# Patient Record
Sex: Male | Born: 1965 | Race: White | Hispanic: No | Marital: Single | Smoking: Never smoker
Health system: Southern US, Community
[De-identification: ages and names within clinical notes are randomized; demographics above are authoritative.]

---

## 2011-10-04 ENCOUNTER — Other Ambulatory Visit: Payer: Self-pay | Admitting: Orthopedic Surgery

## 2011-10-04 DIAGNOSIS — M25531 Pain in right wrist: Secondary | ICD-10-CM

## 2011-10-04 DIAGNOSIS — R531 Weakness: Secondary | ICD-10-CM

## 2011-10-04 DIAGNOSIS — R609 Edema, unspecified: Secondary | ICD-10-CM

## 2011-10-05 ENCOUNTER — Ambulatory Visit
Admission: RE | Admit: 2011-10-05 | Discharge: 2011-10-05 | Disposition: A | Discharge status: Home / Self Care | Payer: 59 | Source: Ambulatory Visit | Attending: Orthopedic Surgery | Admitting: Orthopedic Surgery

## 2011-10-05 DIAGNOSIS — R531 Weakness: Secondary | ICD-10-CM

## 2011-10-05 DIAGNOSIS — R609 Edema, unspecified: Secondary | ICD-10-CM

## 2011-10-05 DIAGNOSIS — M25531 Pain in right wrist: Secondary | ICD-10-CM

## 2018-04-16 ENCOUNTER — Ambulatory Visit (INDEPENDENT_AMBULATORY_CARE_PROVIDER_SITE_OTHER): Payer: BLUE CROSS/BLUE SHIELD | Admitting: Orthopedic Surgery

## 2018-04-16 ENCOUNTER — Encounter (INDEPENDENT_AMBULATORY_CARE_PROVIDER_SITE_OTHER): Payer: Self-pay | Admitting: Orthopedic Surgery

## 2018-04-16 ENCOUNTER — Ambulatory Visit (INDEPENDENT_AMBULATORY_CARE_PROVIDER_SITE_OTHER): Payer: Self-pay

## 2018-04-16 VITALS — Ht 75.0 in | Wt 240.0 lb

## 2018-04-16 DIAGNOSIS — S838X2A Sprain of other specified parts of left knee, initial encounter: Secondary | ICD-10-CM

## 2018-04-16 DIAGNOSIS — G8929 Other chronic pain: Secondary | ICD-10-CM

## 2018-04-16 DIAGNOSIS — M25562 Pain in left knee: Secondary | ICD-10-CM | POA: Diagnosis not present

## 2018-04-16 DIAGNOSIS — Y9367 Activity, basketball: Secondary | ICD-10-CM

## 2018-04-16 MED ORDER — LIDOCAINE HCL 1 % IJ SOLN
5.0000 mL | INTRAMUSCULAR | Status: AC | PRN
Start: 2018-04-16 — End: 2018-04-16
  Administered 2018-04-16: 5 mL

## 2018-04-16 MED ORDER — METHYLPREDNISOLONE ACETATE 40 MG/ML IJ SUSP
40.0000 mg | INTRAMUSCULAR | Status: AC | PRN
  Administered 2018-04-16: 40 mg via INTRA_ARTICULAR

## 2018-04-16 MED ORDER — BUPIVACAINE HCL 0.25 % IJ SOLN
4.0000 mL | INTRAMUSCULAR | Status: AC | PRN
  Administered 2018-04-16: 4 mL via INTRA_ARTICULAR

## 2018-04-16 NOTE — Progress Notes (Signed)
Office Visit Note   Patient: Kyle Vaughan           Date of Birth: 03/01/1966           MRN: [id number] Visit Date: 04/16/2018 Requested by: No referring provider defined for this encounter. PCP: Patient, No Pcp Per  Subjective: Chief Complaint  Patient presents with  . Left Knee - Pain    HPI: Kyle Vaughan is a patient with left knee pain for 2 months.  Reports swelling.  He injured it playing basketball a few months ago.  Did well but then woke up with pain.  Describes new popping and primarily lateral more than medial sided joint symptoms.  He does desk work.  He plays basketball in church league every week but has not played basketball since this injury.  Going up and down stairs is painful.  He has been taking some over-the-counter medication without much relief.  That amount has been minimal.              ROS: All systems reviewed are negative as they relate to the chief complaint within the history of present illness.  Patient denies  fevers or chills.   Assessment & Plan: Visit Diagnoses:  1. Chronic pain of left knee   2. Injury of meniscus of left knee, initial encounter     Plan: Impression is left knee pain with normal radiographs and likely degenerative meniscal pathology present.  I am to try an injection today and then get an MRI scan due to length of symptoms.  He is had symptoms now for 2 months.  See him back after that study and we can decide for or against any further type of intervention depending on what it shows.  This is definitely a change for him compared to how he was feeling in October.  Follow-Up Instructions: Return for after MRI.   Orders:  Orders Placed This Encounter  Procedures  . XR KNEE 3 VIEW LEFT  . MR Knee Left w/o contrast   No orders of the defined types were placed in this encounter.     Procedures: Large Joint Inj: L knee on 04/16/2018 11:39 AM Indications: diagnostic evaluation, joint swelling and pain Details: 18 G 1.5 in needle,  superolateral approach  Arthrogram: No  Medications: 5 mL lidocaine 1 %; 40 mg methylPREDNISolone acetate 40 MG/ML; 4 mL bupivacaine 0.25 % Outcome: tolerated well, no immediate complications Procedure, treatment alternatives, risks and benefits explained, specific risks discussed. Consent was given by the patient. Immediately prior to procedure a time out was called to verify the correct patient, procedure, equipment, support staff and site/side marked as required. Patient was prepped and draped in the usual sterile fashion.       Clinical Data: No additional findings.  Objective: Vital Signs: Ht 6\' 3"  (1.905 m)   Wt 240 lb (108.9 kg)   BMI 30.00 kg/m   Physical Exam:   Constitutional: Patient appears well-developed HEENT:  Head: Normocephalic Eyes:EOM are normal Neck: Normal range of motion Cardiovascular: Normal rate Pulmonary/chest: Effort normal Neurologic: Patient is alert Skin: Skin is warm Psychiatric: Patient has normal mood and affect    Ortho Exam: Ortho exam demonstrates full active and passive range of motion of that left knee.  There is a little bit of lateral joint line tenderness extensor mechanism is intact.  Collateral cruciate ligaments feel stable.  Pedal pulses palpable.  No groin pain with internal X rotation of the leg.  No other masses lymphadenopathy or  skin changes noted in that left knee region  Specialty Comments:  No specialty comments available.  Imaging: Xr Knee 3 View Left  Result Date: 04/16/2018 AP lateral outlet left knee reviewed.  No significant fracture or dislocation is present.  Mild medial joint space narrowing is noted.  Alignment normal.  Patella well aligned within the trochlea.  No spurring.    PMFS History: There are no active problems to display for this patient.  History reviewed. No pertinent past medical history.  History reviewed. No pertinent family history.  History reviewed. No pertinent surgical  history. Social History   Occupational History  . Not on file  Tobacco Use  . Smoking status: Never Smoker  . Smokeless tobacco: Never Used  Substance and Sexual Activity  . Alcohol use: Yes  . Drug use: Not on file  . Sexual activity: Not on file

## 2018-04-25 ENCOUNTER — Ambulatory Visit
Admission: RE | Admit: 2018-04-25 | Discharge: 2018-04-25 | Disposition: A | Discharge status: Home / Self Care | Payer: BLUE CROSS/BLUE SHIELD | Source: Ambulatory Visit | Attending: Orthopedic Surgery | Admitting: Orthopedic Surgery

## 2018-04-25 DIAGNOSIS — G8929 Other chronic pain: Secondary | ICD-10-CM

## 2018-04-25 DIAGNOSIS — M25562 Pain in left knee: Principal | ICD-10-CM

## 2018-05-02 ENCOUNTER — Ambulatory Visit (INDEPENDENT_AMBULATORY_CARE_PROVIDER_SITE_OTHER): Payer: BLUE CROSS/BLUE SHIELD | Admitting: Orthopedic Surgery

## 2018-05-02 ENCOUNTER — Encounter (INDEPENDENT_AMBULATORY_CARE_PROVIDER_SITE_OTHER): Payer: Self-pay | Admitting: Orthopedic Surgery

## 2018-05-02 DIAGNOSIS — M25562 Pain in left knee: Secondary | ICD-10-CM | POA: Diagnosis not present

## 2018-05-02 DIAGNOSIS — G8929 Other chronic pain: Secondary | ICD-10-CM

## 2018-05-02 MED ORDER — PREDNISONE 5 MG (21) PO TBPK: ORAL_TABLET | ORAL | 0 refills | Status: AC

## 2018-05-02 NOTE — Progress Notes (Signed)
Office Visit Note   Patient: Kyle Vaughan           Date of Birth: 12/09/1965           MRN: [id number] Visit Date: 05/02/2018 Requested by: No referring provider defined for this encounter. PCP: Patient, No Pcp Per  Subjective: Chief Complaint  Patient presents with  . Left Knee - Follow-up    HPI: [name] is a patient with left knee pain.  Here for MRI scan review.  That scan is reviewed with the patient.  Collateral and cruciate ligaments intact.  Only a small area of mild degenerative change noted on the lateral tibial plateau.  The rest of the knee exam is normal.  He is having a little bit of pain posteriorly.  Did have a little denervation of that medial gastroc of uncertain significance.  He denies much in the way of back pain.  Not taking any medication for the problem but he does have a history of sciatica.              ROS: All systems reviewed are negative as they relate to the chief complaint within the history of present illness.  Patient denies  fevers or chills.   Assessment & Plan: Visit Diagnoses:  1. Chronic pain of left knee     Plan: Impression is left knee pain with structurally no surgical problem in the left knee.  Has little bit of muscle atrophy in the leg.  Plan is observation for now.  Had an injection in the knee which helped.  He has not played any basketball since.  I think this could be referred pain from his hip although the hip exam is normal and could be referred pain from the back which is possible but unlikely.  I will try him on a Medrol Dosepak and if that does not help we will need to either go ahead and work-up his back or just follow him along to see how the symptoms evolve to see if it is a little bit clear where his leg symptoms are coming from.  I do not think there is a structural problem in the knee that needs any surgical intervention.  Follow-Up Instructions: Return if symptoms worsen or fail to improve.   Orders:  No orders of the  defined types were placed in this encounter.  Meds ordered this encounter  Medications  . predniSONE (STERAPRED UNI-PAK 21 TAB) 5 MG (21) TBPK tablet    Sig: Take dosepak as directed    Dispense:  21 tablet    Refill:  0      Procedures: No procedures performed   Clinical Data: No additional findings.  Objective: Vital Signs: There were no vitals taken for this visit.  Physical Exam:   Constitutional: Patient appears well-developed HEENT:  Head: Normocephalic Eyes:EOM are normal Neck: Normal range of motion Cardiovascular: Normal rate Pulmonary/chest: Effort normal Neurologic: Patient is alert Skin: Skin is warm Psychiatric: Patient has normal mood and affect    Ortho Exam: Ortho exam demonstrates full active and passive range of motion of the left knee.  Collateral crucial ligaments are stable.  No focal joint line tenderness is present.  Not much in way of muscle atrophy in the left leg versus right leg.  No groin pain with internal X rotation of the leg.  No other masses lymphadenopathy or skin changes noted in that left leg region.  Specialty Comments:  No specialty comments available.  Imaging: No results found.  PMFS History: There are no active problems to display for this patient.  History reviewed. No pertinent past medical history.  History reviewed. No pertinent family history.  History reviewed. No pertinent surgical history. Social History   Occupational History  . Not on file  Tobacco Use  . Smoking status: Never Smoker  . Smokeless tobacco: Never Used  Substance and Sexual Activity  . Alcohol use: Yes  . Drug use: Not on file  . Sexual activity: Not on file

## 2020-07-09 IMAGING — MR MR KNEE*L* W/O CM
7 series · 39 of 40 positions shown · non-contrast
Comparison: None.

CLINICAL DATA: Diffuse left knee pain and swelling for a couple of
months. Pain started after playing basketball. No discrete injury.

EXAM:
MRI OF THE LEFT KNEE WITHOUT CONTRAST
TECHNIQUE: Multiplanar, multisequence MR imaging of the knee was performed. No
intravenous contrast was administered.

[Series 6: T2 fat-sat · axial · left · 4.0mm · 0.50mm/px · z∈[-101,+53]mm · 7 of 36 slices shown (1 of 4)]
[im 1/36]
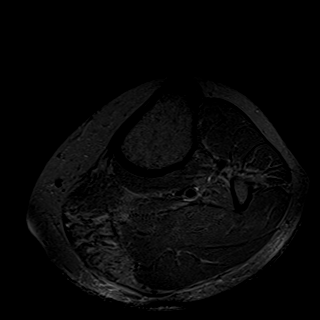
[im 6/36]
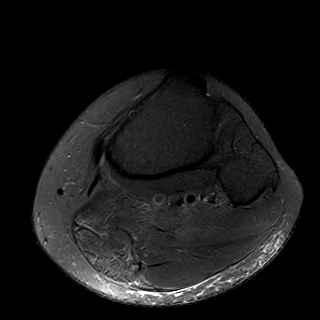
[im 12/36]
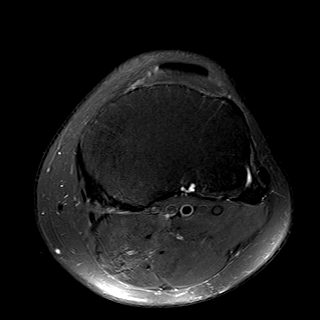
[im 18/36]
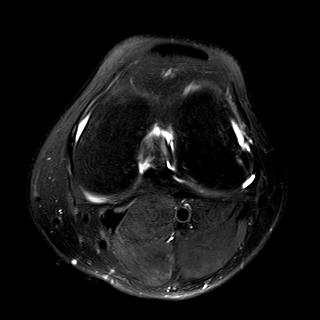
[im 24/36]
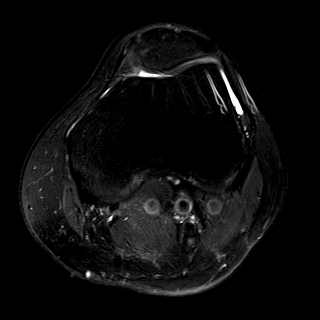
[im 30/36]
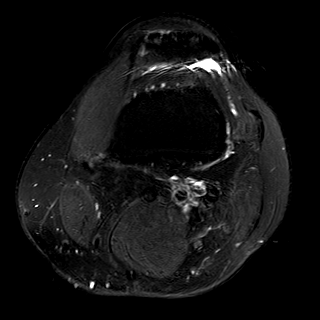
[im 36/36]
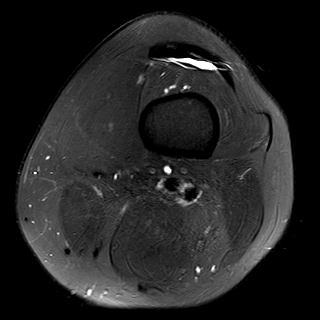

[Series 7: T2 fat-sat · coronal · left · 4.0mm · 0.39mm/px · 5 of 28 slices shown (2 of 4)]
[im 1/28]
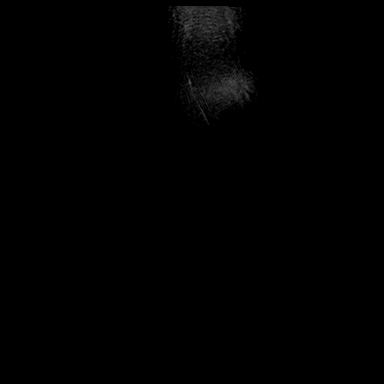
[im 7/28]
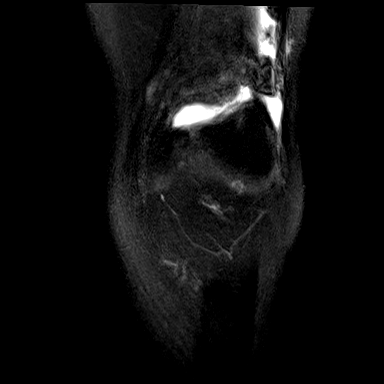
[im 14/28]
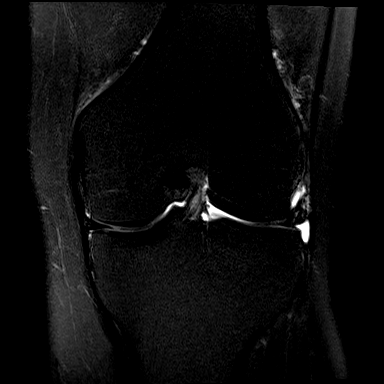
[im 21/28]
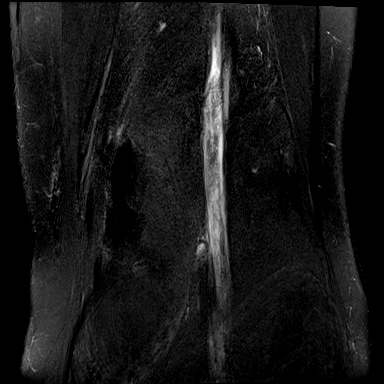
[im 28/28]
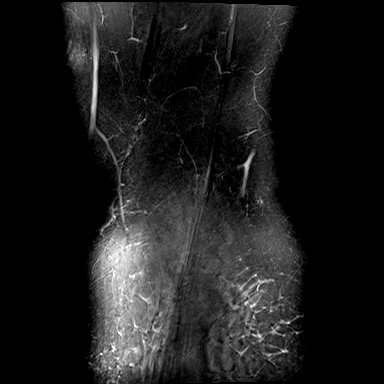

[Series 8: T1 · coronal · left · 4.0mm · 0.39mm/px · 4 of 28 slices shown]
[im 1/28]
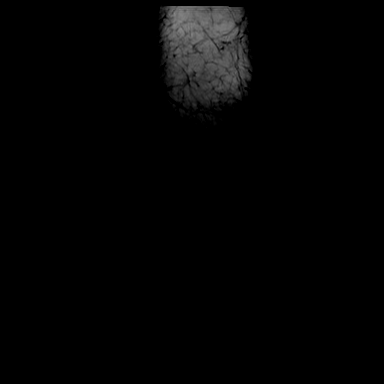
[im 7/28]
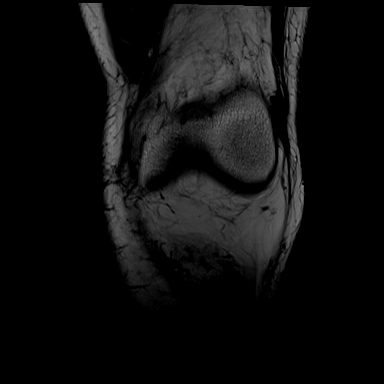
[im 14/28]
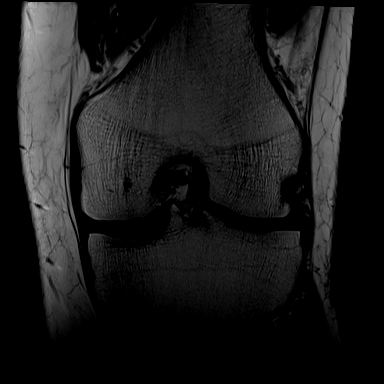
[im 21/28]
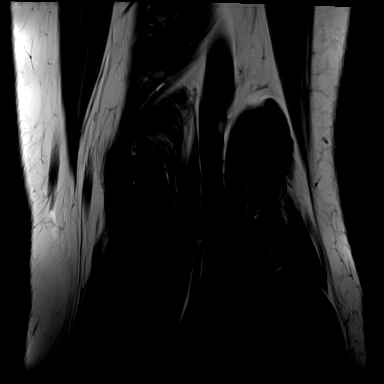

[Series 9: PD fat-sat · coronal · left · 3.0mm · 0.47mm/px · 6 of 32 slices shown (1 of 2)]
[im 1/32]
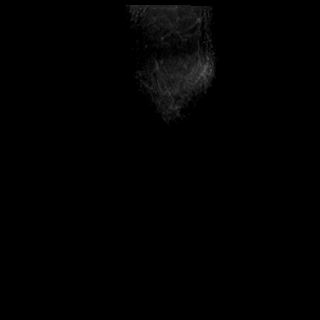
[im 7/32]
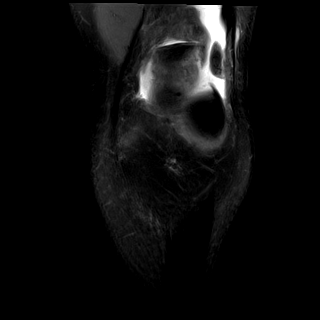
[im 13/32]
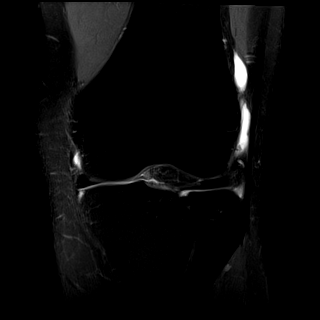
[im 19/32]
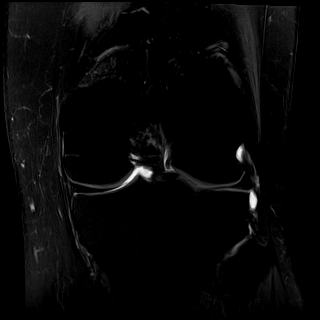
[im 25/32]
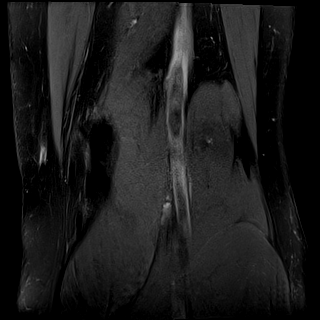
[im 32/32]
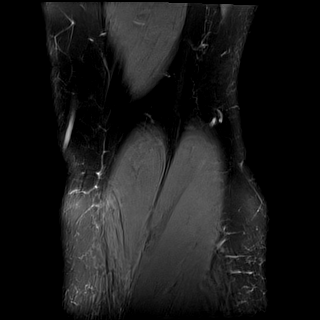

[Series 10: T2 fat-sat · axial · left · 4.0mm · 0.50mm/px · z∈[-101,+53]mm · 7 of 36 slices shown (3 of 4)]
[im 1/36]
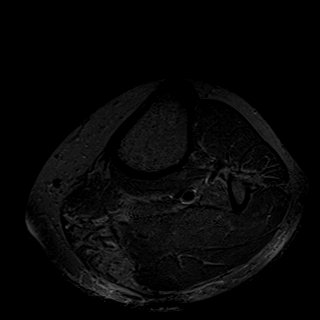
[im 6/36]
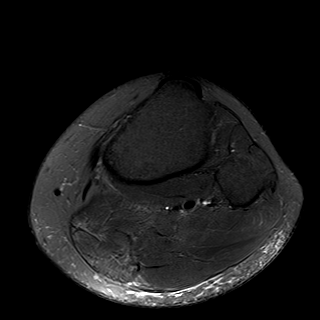
[im 12/36]
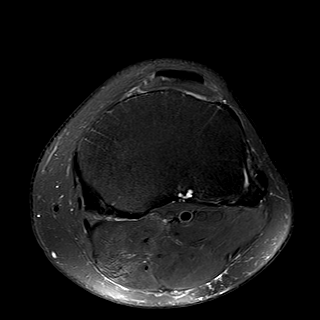
[im 18/36]
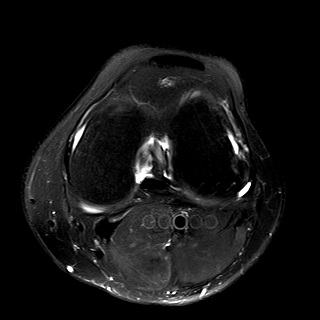
[im 24/36]
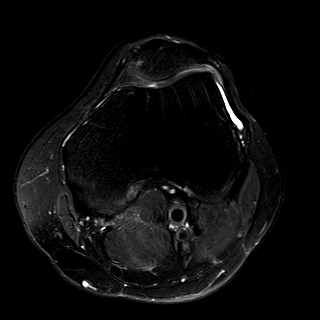
[im 30/36]
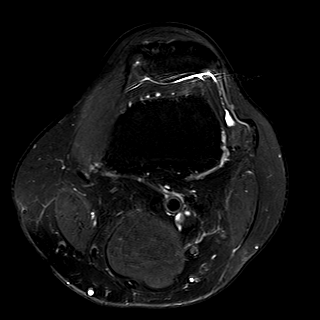
[im 36/36]
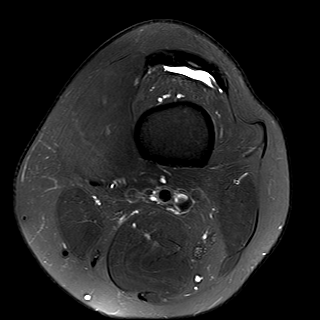

[Series 11: PD fat-sat · sagittal · left · 3.0mm · 0.39mm/px · 5 of 30 slices shown (2 of 2)]
[im 1/30]
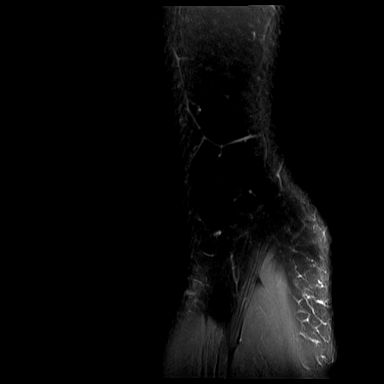
[im 8/30]
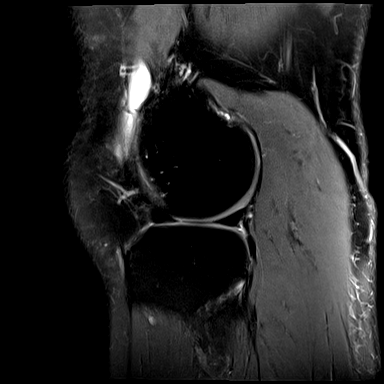
[im 15/30]
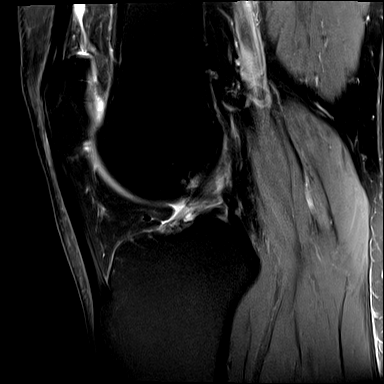
[im 22/30]
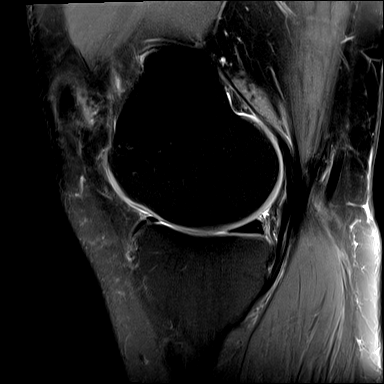
[im 30/30]
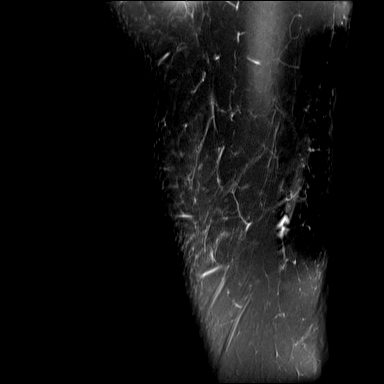

[Series 12: T2 fat-sat · sagittal · left · 3.0mm · 0.39mm/px · 5 of 30 slices shown (4 of 4)]
[im 1/30]
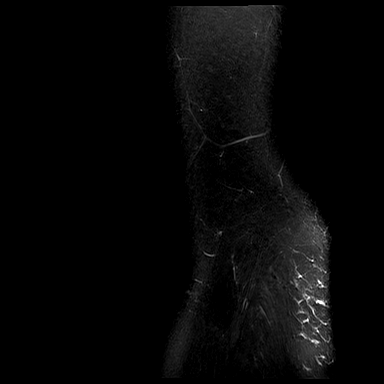
[im 8/30]
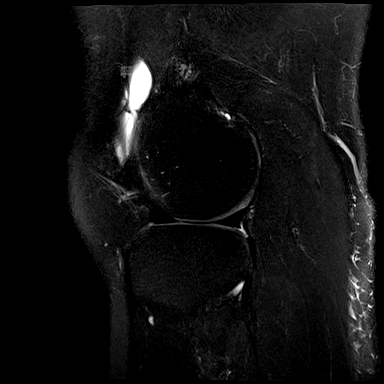
[im 15/30]
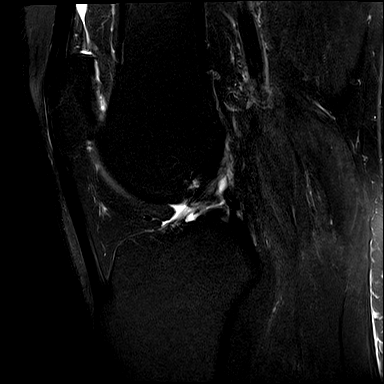
[im 22/30]
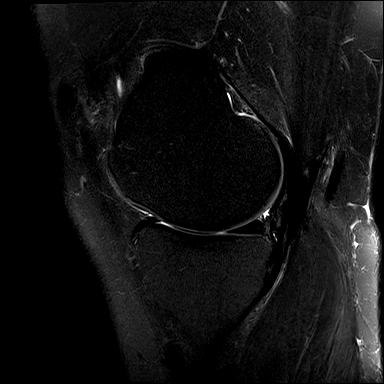
[im 30/30]
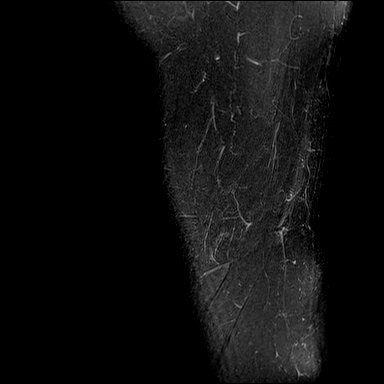

[39 of 40 positions shown; findings below may reference images not displayed]

FINDINGS: MENISCI

Medial meniscus:  Normal.

Lateral meniscus: Slight degeneration of the free edge of the
midbody. No meniscal tear.

LIGAMENTS

Cruciates:  Intact.

Collaterals:  Intact.

CARTILAGE

Patellofemoral:  Normal.

Medial:  Normal.

Lateral: Small focal area of partial cartilage loss of the posterior
aspect of the lateral tibial plateau underlying the posterior horn
of the lateral meniscus. Otherwise normal.

Joint: Small joint effusion. Normal Hoffa's fat pad. No plical
thickening.

Popliteal Fossa: No Baker cyst. Intact popliteus tendon. There is
slight fatty atrophy of the medial head of the gastrocnemius in the
proximal calf.

Extensor Mechanism:  Normal.

Bones:  Normal.

Other: None.
IMPRESSION: 1. Small area of partial-thickness cartilage loss of the posterior
aspect of the lateral tibial plateau. No visible loose body in the
joint.
2. Small joint effusion.
3. Fatty atrophy of the proximal medial head of the gastrocnemius.
This suggest previous muscle injury or denervation.
